# Patient Record
Sex: Male | Born: 1985 | Hispanic: Yes | Marital: Single | Smoking: Never smoker
Health system: Southern US, Community
[De-identification: ages and names within clinical notes are randomized; demographics above are authoritative.]

---

## 2017-07-30 ENCOUNTER — Emergency Department (HOSPITAL_COMMUNITY): Payer: Self-pay

## 2017-07-30 ENCOUNTER — Encounter (HOSPITAL_COMMUNITY): Payer: Self-pay

## 2017-07-30 ENCOUNTER — Emergency Department (HOSPITAL_COMMUNITY)
Admission: EM | Admit: 2017-07-30 | Discharge: 2017-07-31 | Disposition: A | Discharge status: Home / Self Care | Payer: Self-pay | Attending: Emergency Medicine | Admitting: Emergency Medicine

## 2017-07-30 DIAGNOSIS — W132XXA Fall from, out of or through roof, initial encounter: Secondary | ICD-10-CM | POA: Insufficient documentation

## 2017-07-30 DIAGNOSIS — Y999 Unspecified external cause status: Secondary | ICD-10-CM | POA: Insufficient documentation

## 2017-07-30 DIAGNOSIS — Y9389 Activity, other specified: Secondary | ICD-10-CM | POA: Insufficient documentation

## 2017-07-30 DIAGNOSIS — M25561 Pain in right knee: Secondary | ICD-10-CM | POA: Insufficient documentation

## 2017-07-30 DIAGNOSIS — S92214A Nondisplaced fracture of cuboid bone of right foot, initial encounter for closed fracture: Secondary | ICD-10-CM | POA: Insufficient documentation

## 2017-07-30 DIAGNOSIS — W19XXXA Unspecified fall, initial encounter: Secondary | ICD-10-CM

## 2017-07-30 DIAGNOSIS — Y929 Unspecified place or not applicable: Secondary | ICD-10-CM | POA: Insufficient documentation

## 2017-07-30 DIAGNOSIS — R52 Pain, unspecified: Secondary | ICD-10-CM

## 2017-07-30 DIAGNOSIS — M25551 Pain in right hip: Secondary | ICD-10-CM | POA: Insufficient documentation

## 2017-07-30 DIAGNOSIS — S92301A Fracture of unspecified metatarsal bone(s), right foot, initial encounter for closed fracture: Secondary | ICD-10-CM | POA: Insufficient documentation

## 2017-07-30 MED ORDER — OXYCODONE-ACETAMINOPHEN 5-325 MG PO TABS
1.0000 | ORAL_TABLET | Freq: Once | ORAL | Status: DC
  Filled 2017-07-30: qty 1

## 2017-07-30 MED ORDER — OXYCODONE-ACETAMINOPHEN 5-325 MG PO TABS
1.0000 | ORAL_TABLET | Freq: Once | ORAL | Status: AC
  Administered 2017-07-30: 1 via ORAL
  Filled 2017-07-30: qty 1

## 2017-07-30 NOTE — ED Notes (Signed)
Pedal pulse noted and extremity warm to touch.

## 2017-07-30 NOTE — ED Notes (Addendum)
Tried to pull oxycodone for patient but was unable to due to the fact another nurse pulled med already. Patient states a male nurse gave him pain meds at approx 2000pm but documentation was not done.

## 2017-07-30 NOTE — ED Triage Notes (Signed)
Pt presents with R knee and foot pain after falling off roof PTA  Pt reports landing on grass, feet first, denies any LOC.Marland Kitchen.  Pt able to ambulate but with pain.

## 2017-07-30 NOTE — ED Provider Notes (Signed)
[facility] EMERGENCY [facility] Provider Note   CSN: [id number] Arrival date & time: 07/30/17  1841     History   Chief Complaint Chief Complaint  Patient presents with  . Fall    HPI Drew Rodriguez is a 31 y.o. male.  Geoff Jonetta SpeakLuis Juleen Rodriguez is a 31 y.o. Male who presents to the ED after a fall off the roof complaining of right foot pain.  Patient reports he was up on a roof of a one story building about 12 feet in the air.  He reports he fell and landed on his right foot.  He reports pain to his right foot, right knee and his right hip.  He denies hitting his head or loss of consciousness. He denies any pain to his left foot.  He denies any pain to his back.  He reports he is ambulatory after the fall briefly.  No treatments prior to arrival to the emergency department.  He did receive a Percocet and triaged that helps him with his pain.  No other treatments attempted.  He denies numbness, tingling, weakness, head injury, loss of consciousness, back pain, abdominal pain, chest pain or shortness of breath.   The history is provided by the patient, medical records and a friend. A language interpreter was used.  Fall  Pertinent negatives include no chest pain, no abdominal pain, no headaches and no shortness of breath.    History reviewed. No pertinent past medical history.  There are no active problems to display for this patient.   History reviewed. No pertinent surgical history.     Home Medications    Prior to Admission medications   Medication Sig Start Date End Date Taking? Authorizing Provider  HYDROcodone-acetaminophen (NORCO/VICODIN) 5-325 MG tablet Take 1-2 tablets every 6 (six) hours as needed by mouth for moderate pain or severe pain. 07/31/17   Everlene Farrieransie, Nickolaus Bordelon, PA-C  naproxen (NAPROSYN) 375 MG tablet Take 1 tablet (375 mg total) 2 (two) times daily with a meal by mouth. 07/31/17   Everlene Farrieransie, Brooklyn Alfredo, PA-C    Family History History  reviewed. No pertinent family history.  Social History Social History   Tobacco Use  . Smoking status: Never Smoker  . Smokeless tobacco: Never Used  Substance Use Topics  . Alcohol use: Yes  . Drug use: No     Allergies   Patient has no known allergies.   Review of Systems Review of Systems  Constitutional: Negative for chills and fever.  HENT: Negative for congestion and sore throat.   Eyes: Negative for visual disturbance.  Respiratory: Negative for cough and shortness of breath.   Cardiovascular: Negative for chest pain.  Gastrointestinal: Negative for abdominal pain, diarrhea, nausea and vomiting.  Genitourinary: Negative for dysuria.  Musculoskeletal: Positive for arthralgias and joint swelling. Negative for back pain and neck pain.  Skin: Negative for rash.  Neurological: Negative for weakness, light-headedness, numbness and headaches.     Physical Exam Updated Vital Signs BP 113/65   Pulse 62   Temp 99.2 F (37.3 C) (Oral)   Resp 17   SpO2 97%   Physical Exam  Constitutional: He appears well-developed and well-nourished. No distress.  HENT:  Head: Normocephalic and atraumatic.  Right Ear: External ear normal.  Left Ear: External ear normal.  Eyes: Conjunctivae are normal. Pupils are equal, round, and reactive to light. Right eye exhibits no discharge. Left eye exhibits no discharge.  Neck: Neck supple.  Cardiovascular: Normal rate, regular rhythm, normal heart sounds  and intact distal pulses.  .Bilateral radial, posterior tibialis and dorsalis pedis pulses are intact.    Pulmonary/Chest: Effort normal and breath sounds normal. No respiratory distress.  Abdominal: Soft. There is no tenderness.  Musculoskeletal: He exhibits edema and tenderness. He exhibits no deformity.  Edema and TTP to the dorsum of his right foot without deformity.  No right ankle tenderness.  Mild tenderness to the anterior aspect of his right knee.  Mild tenderness to the lateral  aspect of his right hip.  No midline neck or back tenderness.  No deformity.  Lymphadenopathy:    He has no cervical adenopathy.  Neurological: He is alert. No sensory deficit. Coordination normal.  Skin: Skin is warm and dry. Capillary refill takes less than 2 seconds. No rash noted. He is not diaphoretic. No erythema. No pallor.  Psychiatric: He has a normal mood and affect. His behavior is normal.  Nursing note and vitals reviewed.    ED Treatments / Results  Labs (all labs ordered are listed, but only abnormal results are displayed) Labs Reviewed - No data to display  EKG  EKG Interpretation None       Radiology Dg Lumbar Spine Complete  Result Date: 07/31/2017 CLINICAL DATA:  Fall from roof.  Low back pain. EXAM: LUMBAR SPINE - COMPLETE 4+ VIEW COMPARISON:  None. FINDINGS: There is no evidence of lumbar spine fracture. Alignment is normal. Intervertebral disc spaces are maintained. IMPRESSION: No fracture or listhesis of the lumbar spine. Electronically Signed   By: Deatra RobinsonKevin  Herman M.D.   On: 07/31/2017 00:29   Dg Ankle Complete Right  Result Date: 07/30/2017 CLINICAL DATA:  Right ankle pain after fall from roof. EXAM: RIGHT ANKLE - COMPLETE 3+ VIEW COMPARISON:  None. FINDINGS: Slightly avulsed fracture off the lateral aspect of the cuboid is noted. The ankle and subtalar joints are maintained. No calcaneal fracture. Mild soft tissue swelling about the ankle and midfoot. IMPRESSION: Slightly avulsed acute fracture off the lateral aspect of the cuboid. Electronically Signed   By: Tollie Ethavid  Kwon M.D.   On: 07/30/2017 20:10   Dg Knee Complete 4 Views Right  Result Date: 07/30/2017 CLINICAL DATA:  Patient fell from roof.  Right knee pain. EXAM: RIGHT KNEE - COMPLETE 4+ VIEW COMPARISON:  None. FINDINGS: No evidence of fracture, dislocation, or joint effusion. No evidence of arthropathy or other focal bone abnormality. Soft tissues are unremarkable. IMPRESSION: No acute fracture or  malalignment of the right knee. No joint effusion. Electronically Signed   By: Tollie Ethavid  Kwon M.D.   On: 07/30/2017 20:11   Dg Foot Complete Right  Result Date: 07/30/2017 CLINICAL DATA:  Patient fell from roof.  Foot pain. EXAM: RIGHT FOOT COMPLETE - 3+ VIEW COMPARISON:  None. FINDINGS: There are acute, closed, right second through fourth metatarsal neck fractures with lateral angulation of the distal fracture fragments. No intra-articular involvement is noted. No joint dislocation. Forefoot soft tissue swelling is seen. The Lisfranc articulation is maintained. An acute fracture involving the lateral cortex of the cuboid is noted with slight avulsion. The tibiotalar and subtalar joints are maintained. No calcaneal fracture. IMPRESSION: 1. Acute right second through fourth metatarsal neck fractures with lateral angulation of the distal fracture fragments. No joint dislocations. 2. Fracture of the posterolateral cuboid with minimal avulsion of the fracture fragment. Electronically Signed   By: Tollie Ethavid  Kwon M.D.   On: 07/30/2017 20:09   Dg Hip Unilat With Pelvis 2-3 Views Right  Result Date: 07/31/2017 CLINICAL DATA:  Right  hip pain after falling from roof EXAM: DG HIP (WITH OR WITHOUT PELVIS) 2-3V RIGHT COMPARISON:  None. FINDINGS: There is no evidence of hip fracture or dislocation. There is no evidence of arthropathy or other focal bone abnormality. IMPRESSION: No fracture or dislocation of the right hip. Electronically Signed   By: Deatra Robinson M.D.   On: 07/31/2017 00:30    Procedures Procedures (including critical care time)  Medications Ordered in ED Medications  oxyCODONE-acetaminophen (PERCOCET/ROXICET) 5-325 MG per tablet 1 tablet (not administered)  oxyCODONE-acetaminophen (PERCOCET/ROXICET) 5-325 MG per tablet 1 tablet (1 tablet Oral Given 07/30/17 2311)     Initial Impression / Assessment and Plan / ED Course  I have reviewed the triage vital signs and the nursing notes.  Pertinent  labs & imaging results that were available during my care of the patient were reviewed by me and considered in my medical decision making (see chart for details).    This is a 31 y.o. Male who presents to the ED after a fall off the roof complaining of right foot pain.  Patient reports he was up on a roof of a one story building about 12 feet in the air.  He reports he fell and landed on his right foot.  He reports pain to his right foot, right knee and his right hip.  He denies hitting his head or loss of consciousness. He denies any pain to his left foot.  He denies any pain to his back.  He reports he is ambulatory after the fall briefly.  No treatments prior to arrival to the emergency department.  On exam the patient has tenderness and edema noted to the dorsum of his right foot.  He is neurovascularly intact.  Good capillary refill.  No deformity.  No tenderness to his right ankle.  He does have mild tenderness to the anterior aspect of his right knee and the lateral aspect of his right hip.  He has good range of motion of his knee and hip. X-ray of his right foot shows right second through fourth metatarsal neck fractures with lateral angulation of the distal fracture fragments.  No joint dislocations.  There is also fracture of the posterior lateral cuboid with minimal avulsion of the fracture fragment. Ankle and knee x-rays show no acute findings.  Patient is complaining of right hip pain as well.  Will x-ray his right hip as well as his lumbar spine due to concern for lumbar injury with this significant fall and injury. X-ray of his lumbar spine and hip are unremarkable. He reports feeling better at reevaluation after pain medication. I consulted with orthopedic surgeon Dr. Aundria Rud and he agrees with plan to place the patient in a posterior splint and have him nonweightbearing with crutches.  He will see him in follow-up in about 2-3 weeks.   I discussed this plan with the patient and he agrees.   Will discharge with prescriptions for naproxen and Norco.  The patient was looked up on the [place] controlled substance database.  No recent prescriptions for any narcotic pain medicines.  I discussed pain medication precautions with the patient.  I also discussed precautions when using a splint. I advised the patient to follow-up with their primary care provider this week. I advised the patient to return to the emergency department with new or worsening symptoms or new concerns. The patient verbalized understanding and agreement with plan.     Final Clinical Impressions(s) / ED Diagnoses   Final diagnoses:  Closed fracture of neck of metatarsal bone of right foot, initial encounter  Closed nondisplaced fracture of cuboid of right foot, initial encounter  Fall from roof, initial encounter  Acute pain of right knee  Right hip pain    ED Discharge Orders        Ordered    naproxen (NAPROSYN) 375 MG tablet  2 times daily with meals     07/31/17 0203    HYDROcodone-acetaminophen (NORCO/VICODIN) 5-325 MG tablet  Every 6 hours PRN     07/31/17 0203       Everlene Farrier, PA-C 07/31/17 0211    Mancel Bale, MD 08/08/17 [phone]

## 2017-07-30 NOTE — ED Notes (Signed)
Pt. To xray via stretcher
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
## 2017-07-30 NOTE — ED Notes (Signed)
Pt. Return from XRAY via stretcher. 

## 2017-07-31 MED ORDER — HYDROCODONE-ACETAMINOPHEN 5-325 MG PO TABS: 1.0000 | ORAL_TABLET | Freq: Four times a day (QID) | ORAL | 0 refills | Status: AC | PRN

## 2017-07-31 MED ORDER — NAPROXEN 375 MG PO TABS: 375.0000 mg | ORAL_TABLET | Freq: Two times a day (BID) | ORAL | 0 refills | Status: AC

## 2017-07-31 NOTE — ED Notes (Signed)
Ortho Tech Paged 

## 2017-07-31 NOTE — Progress Notes (Signed)
Orthopedic Tech Progress Note Patient Details:  Hillery AldoJose Luis [facility] 01-13-1986 [id number]  Ortho Devices Type of Ortho Device: Crutches, Post (short leg) splint Ortho Device/Splint Location: rle Ortho Device/Splint Interventions: Ordered, Application, Adjustment   Trinna PostMartinez, Adalea Handler J 07/31/2017, 1:55 AM

## 2018-11-06 IMAGING — DX DG LUMBAR SPINE COMPLETE 4+V
5 series · 5 of 5 positions shown · non-contrast
Comparison: None.

CLINICAL DATA: Fall from roof.  Low back pain.

EXAM:
LUMBAR SPINE - COMPLETE 4+ VIEW

[l-spine ap]
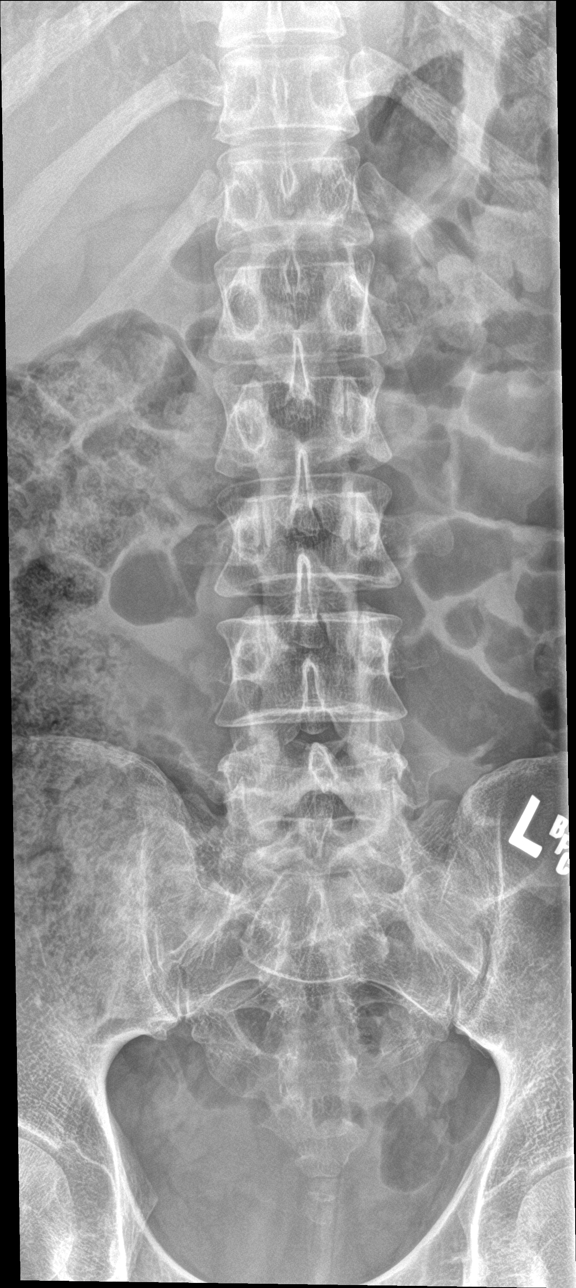

[l-spine obl (1 of 2)]
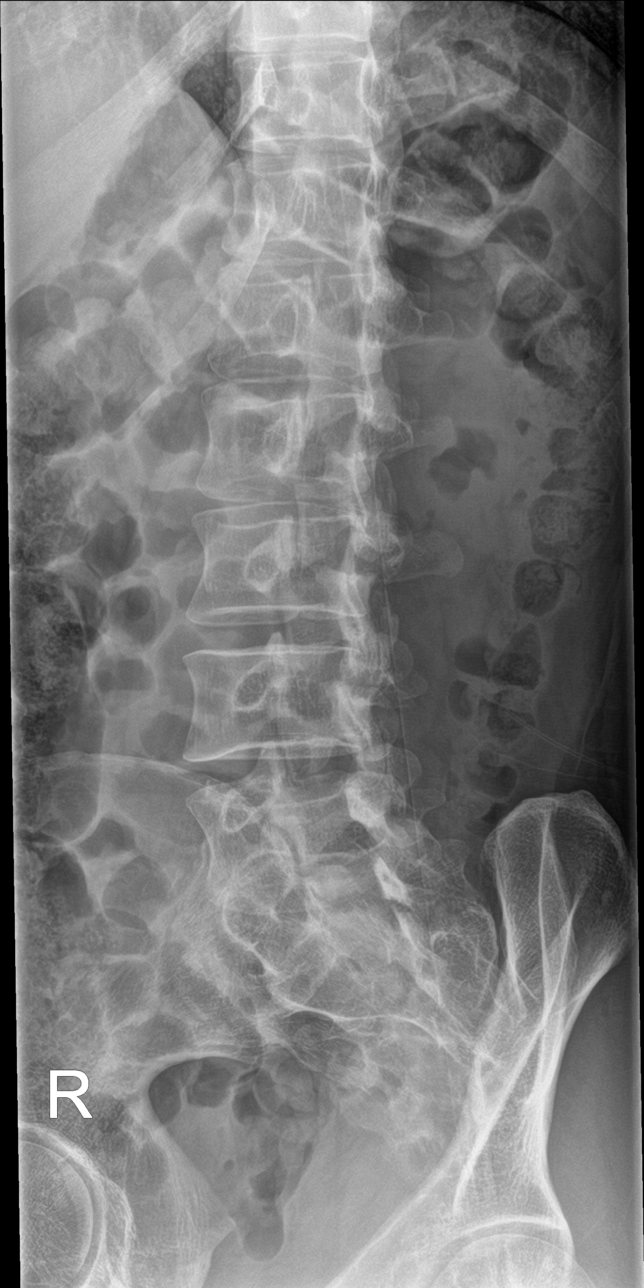

[l-spine obl (2 of 2)]
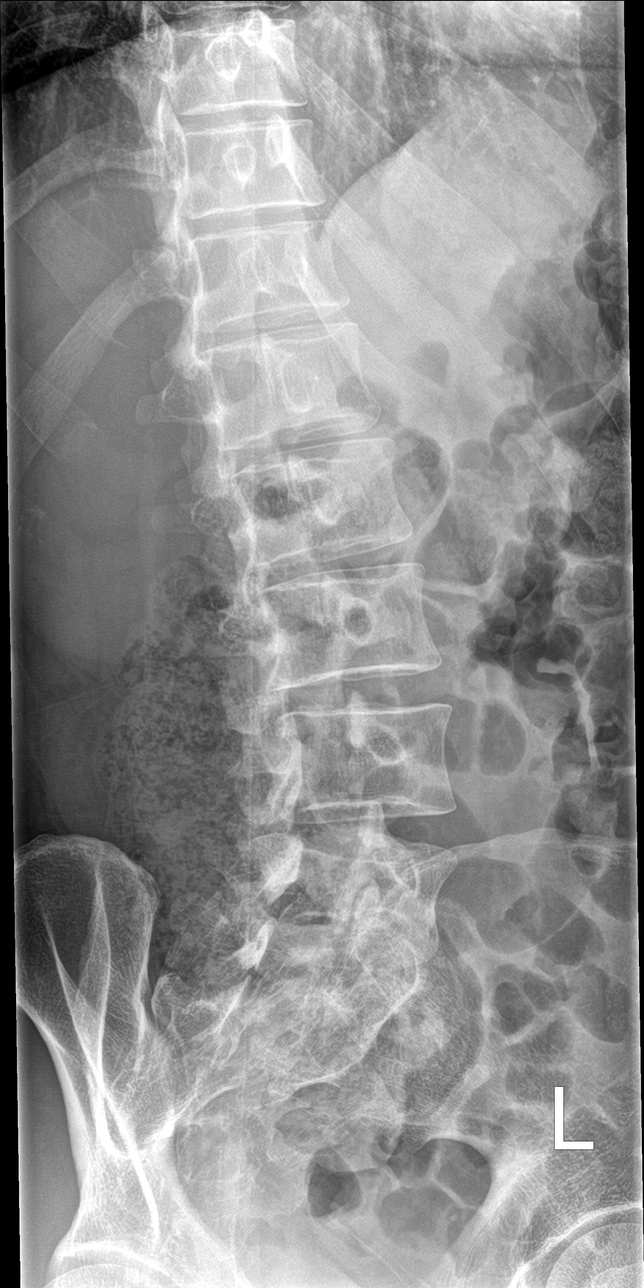

[l-spine lat]
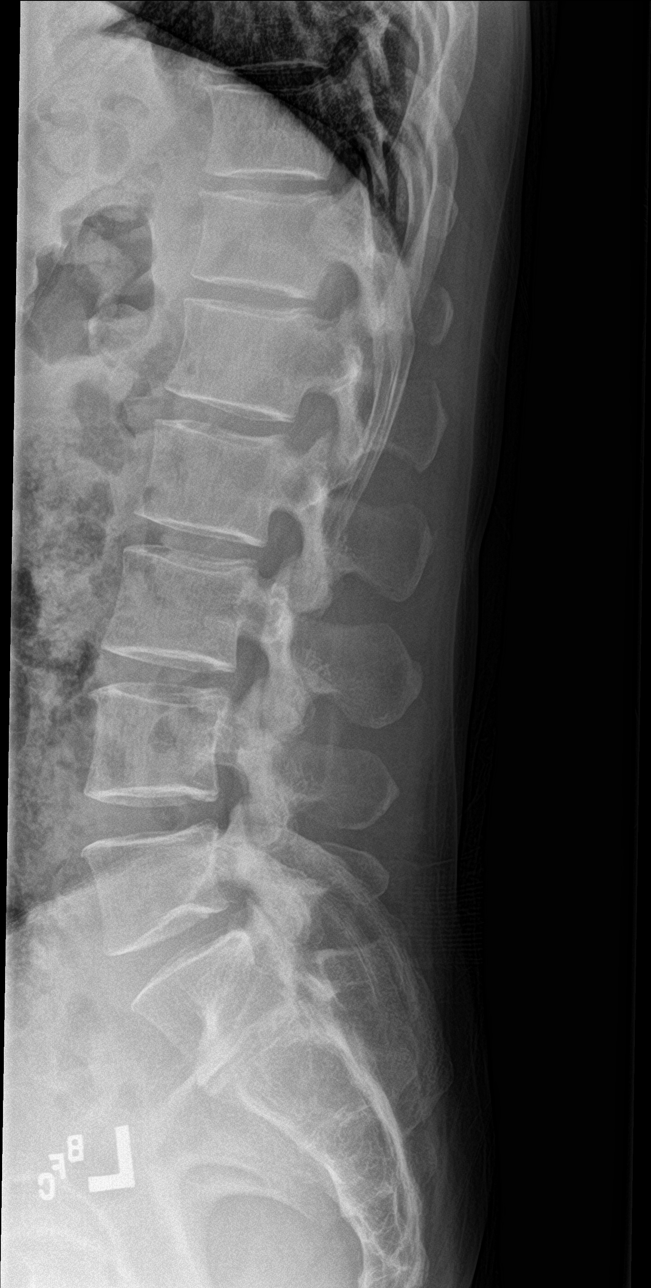

[l-spine spot]
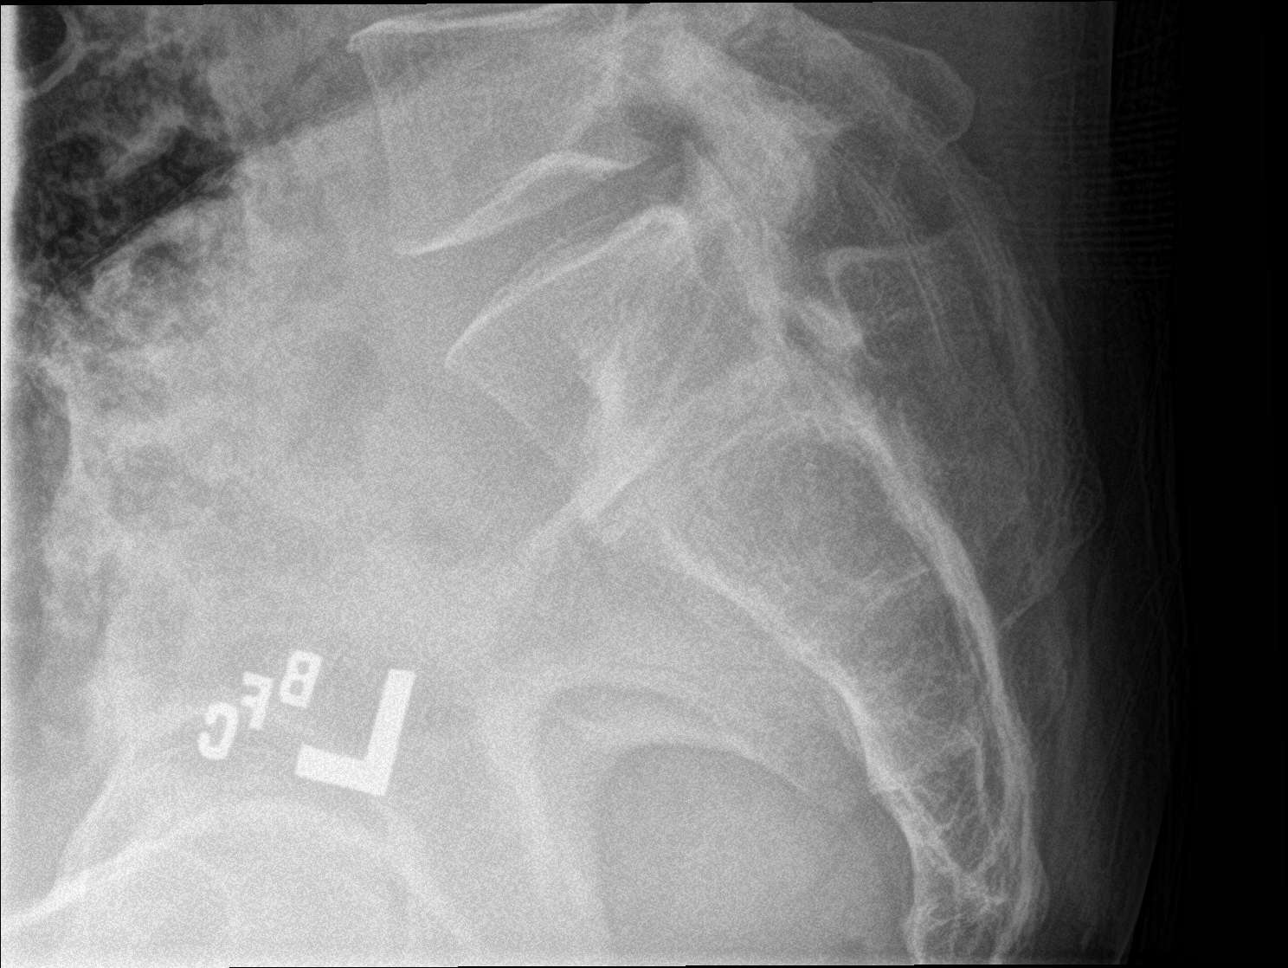

[5 of 5 positions shown; findings below may reference images not displayed]

FINDINGS: There is no evidence of lumbar spine fracture. Alignment is normal.
Intervertebral disc spaces are maintained.
IMPRESSION: No fracture or listhesis of the lumbar spine.

## 2018-11-06 IMAGING — CR DG ANKLE COMPLETE 3+V*R*
3 series · 3 of 3 positions shown · non-contrast
Comparison: None.

CLINICAL DATA: Right ankle pain after fall from roof.

EXAM:
RIGHT ANKLE - COMPLETE 3+ VIEW

[ankle ap]
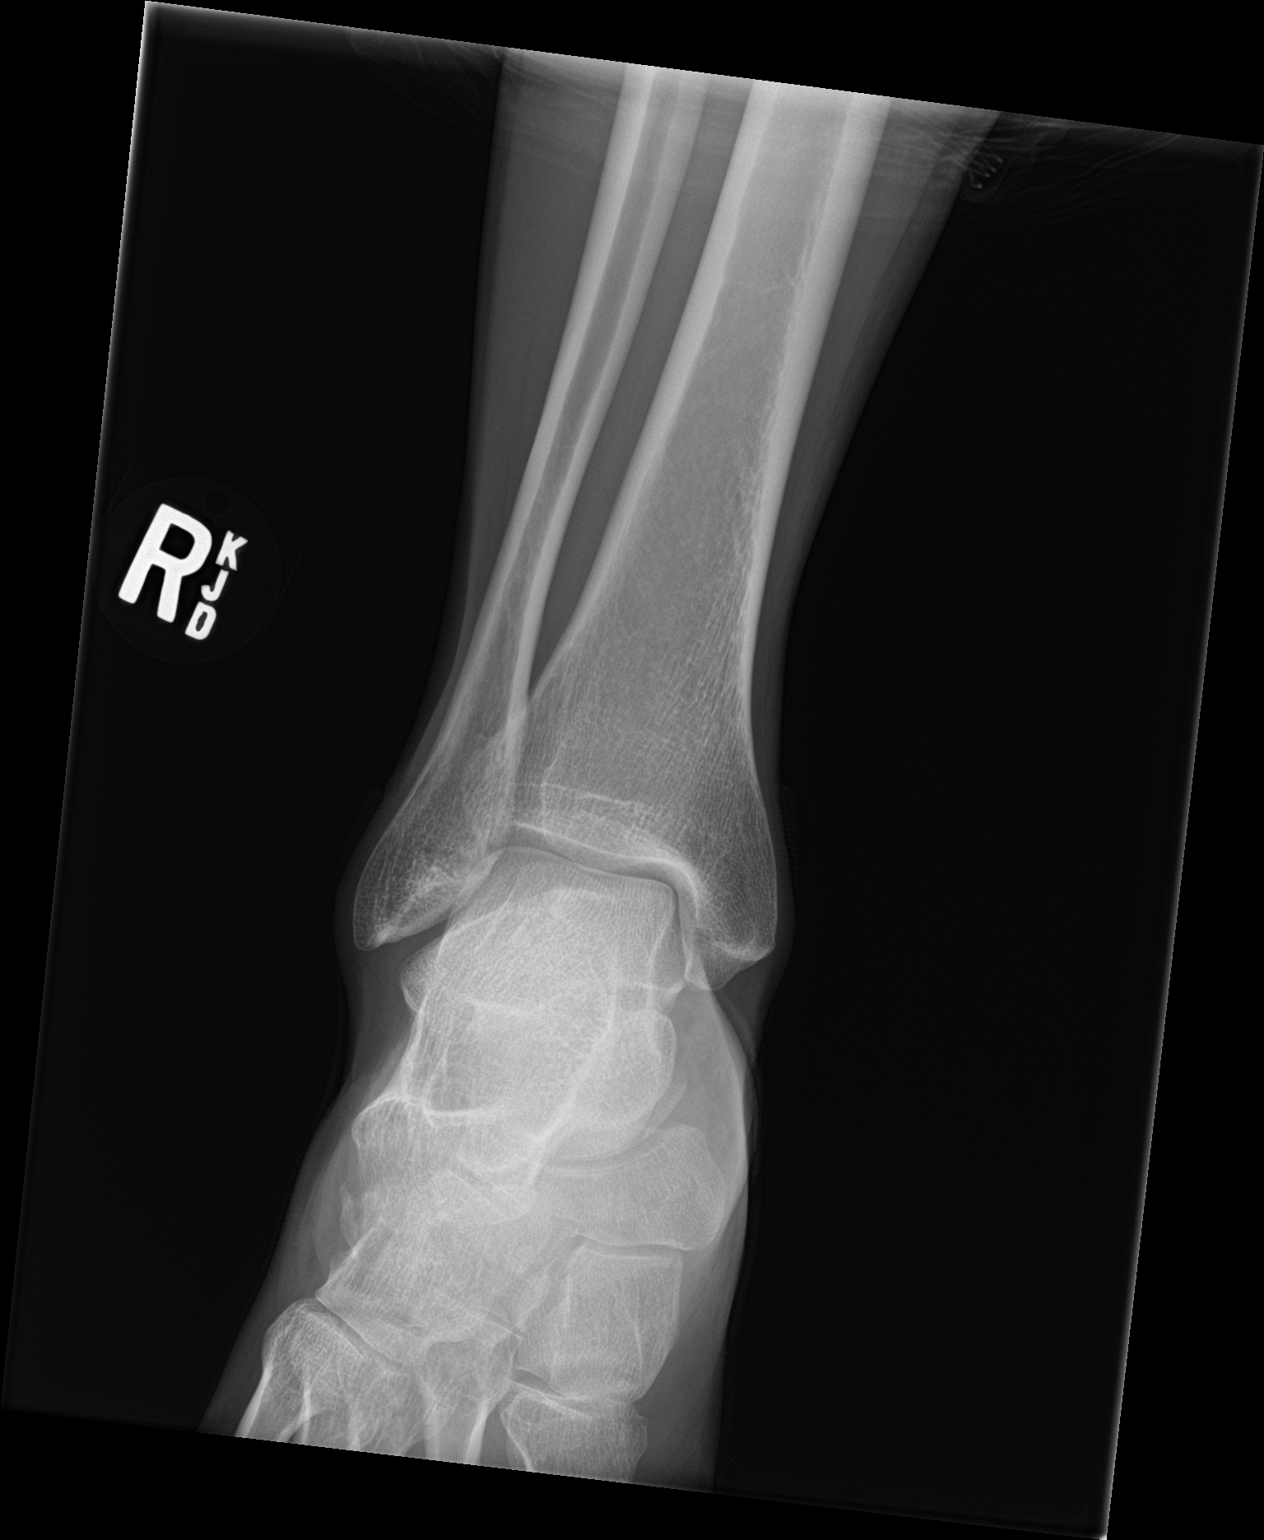

[ankle obl]
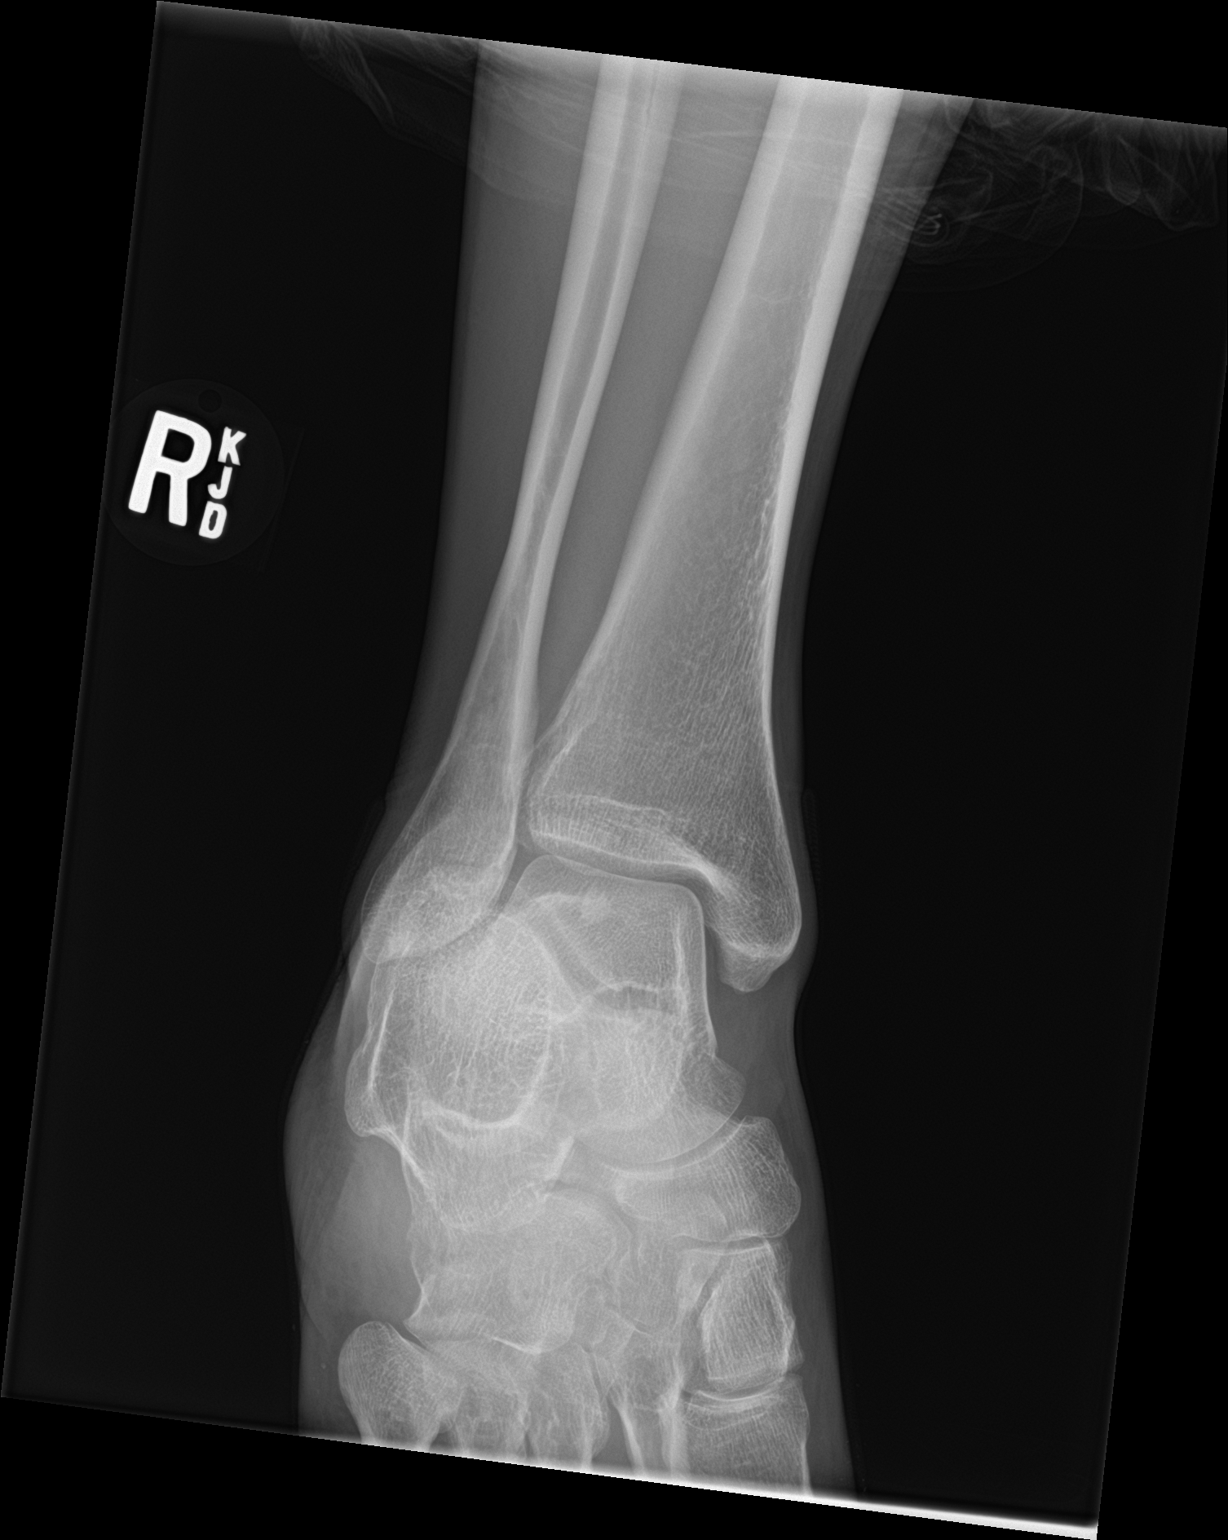

[ankle lat]
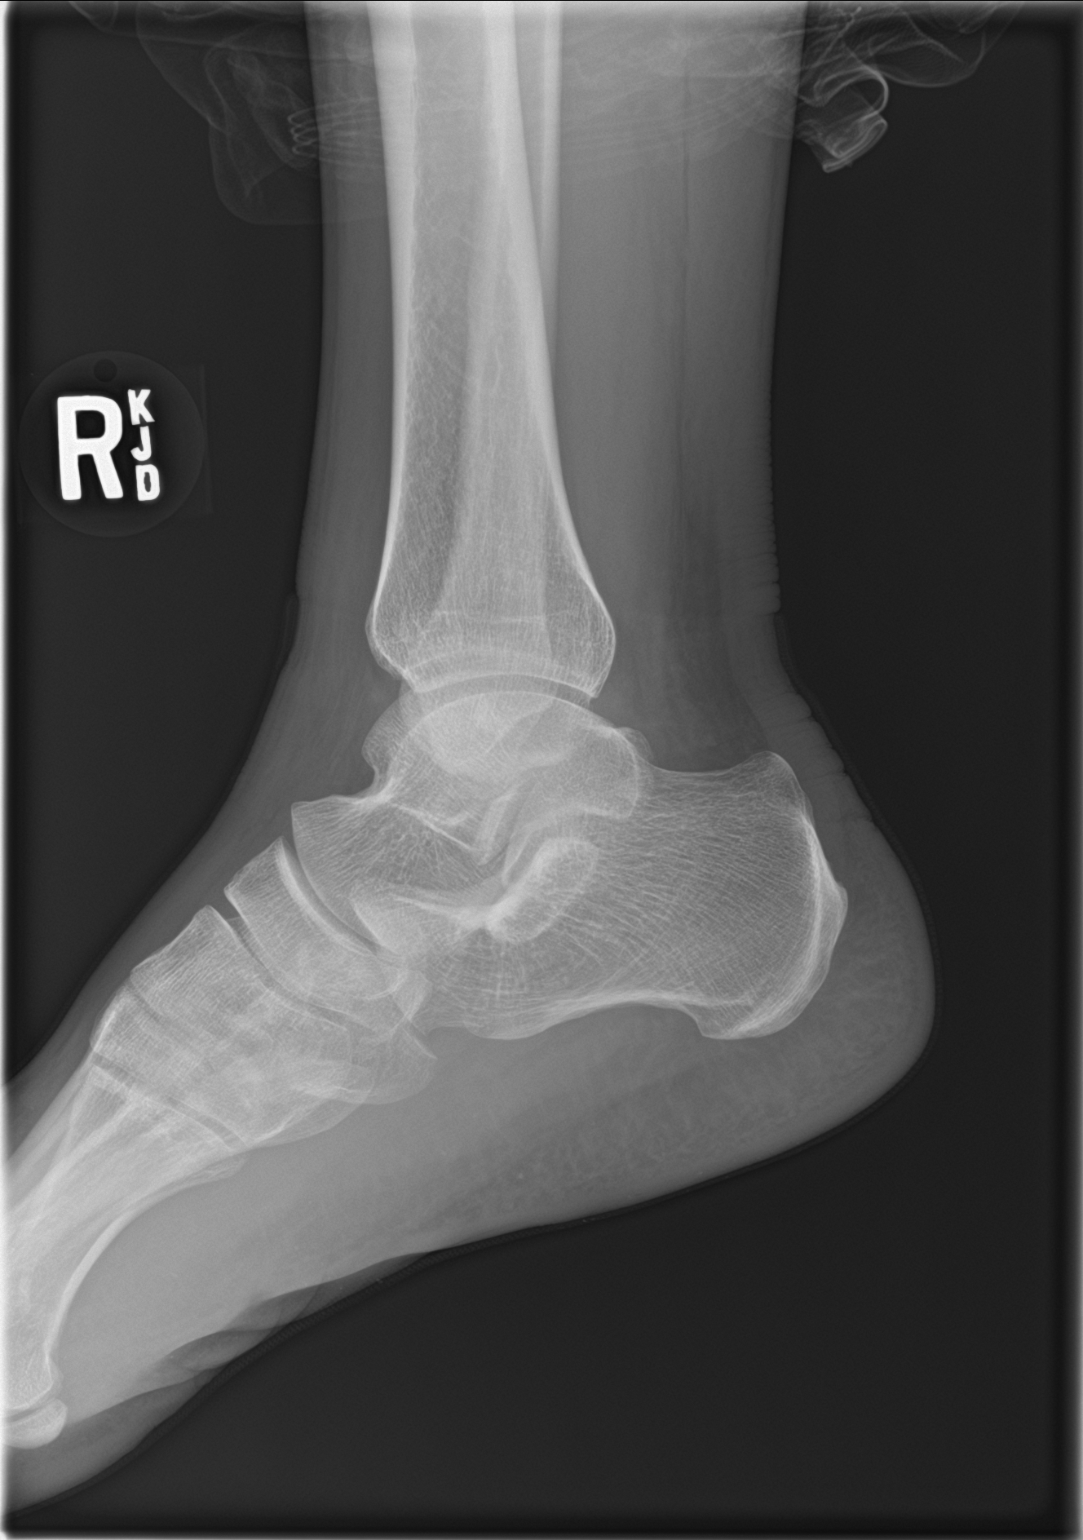

[3 of 3 positions shown; findings below may reference images not displayed]

FINDINGS: Slightly avulsed fracture off the lateral aspect of the cuboid is
noted. The ankle and subtalar joints are maintained. No calcaneal
fracture. Mild soft tissue swelling about the ankle and midfoot.
IMPRESSION: Slightly avulsed acute fracture off the lateral aspect of the
cuboid.

## 2018-11-06 IMAGING — CR DG KNEE COMPLETE 4+V*R*
4 series · 4 of 4 positions shown · non-contrast
Comparison: None.

CLINICAL DATA: Patient fell from roof.  Right knee pain.

EXAM:
RIGHT KNEE - COMPLETE 4+ VIEW

[knee ap]
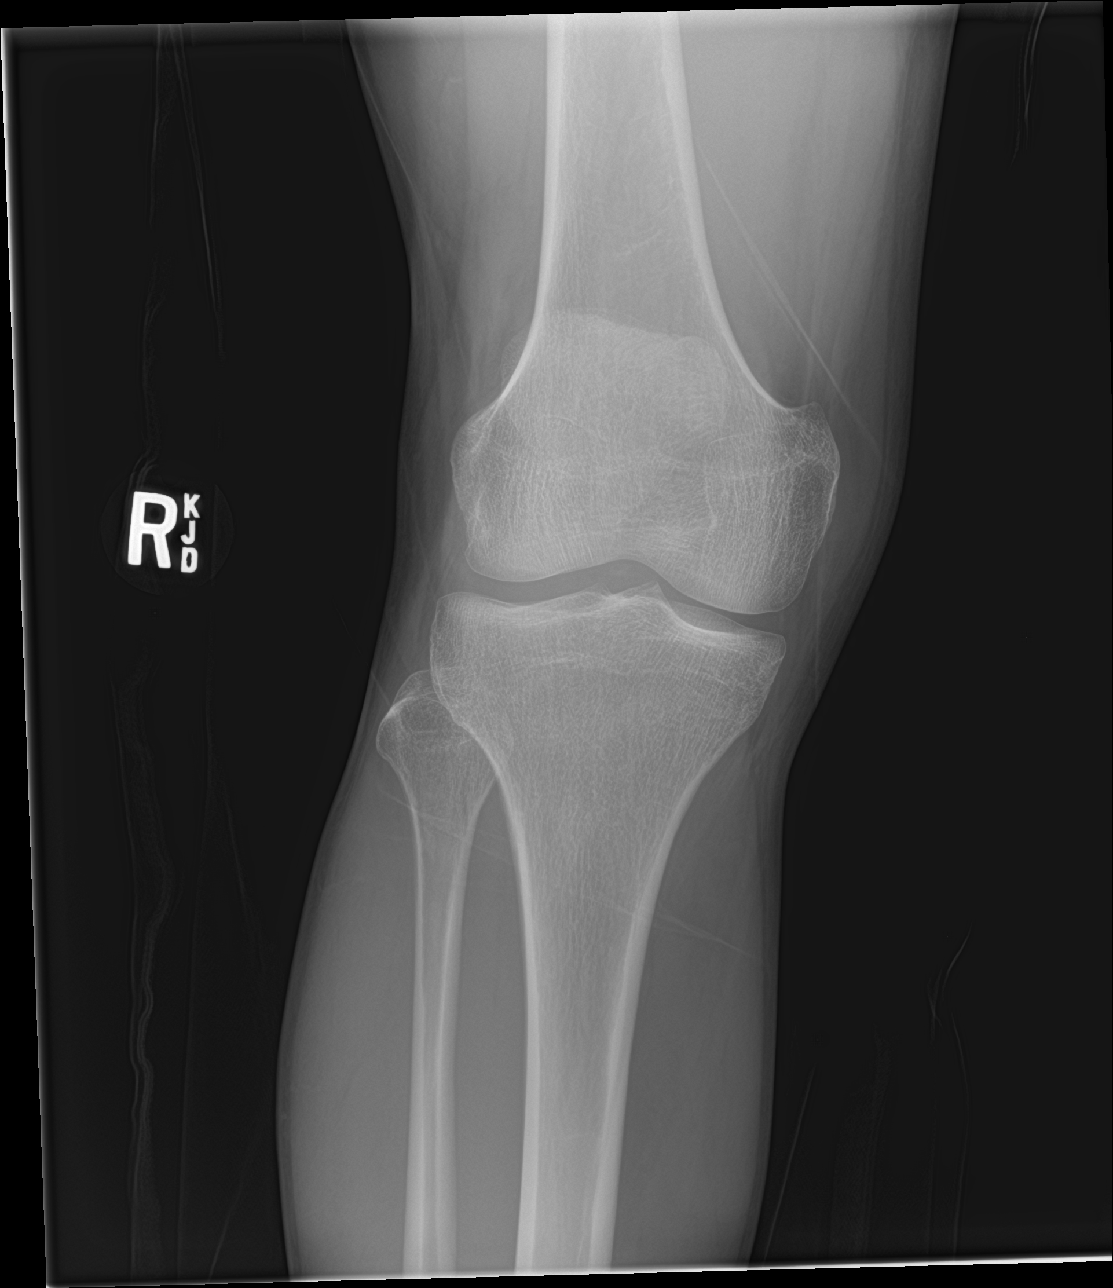

[knee lat]
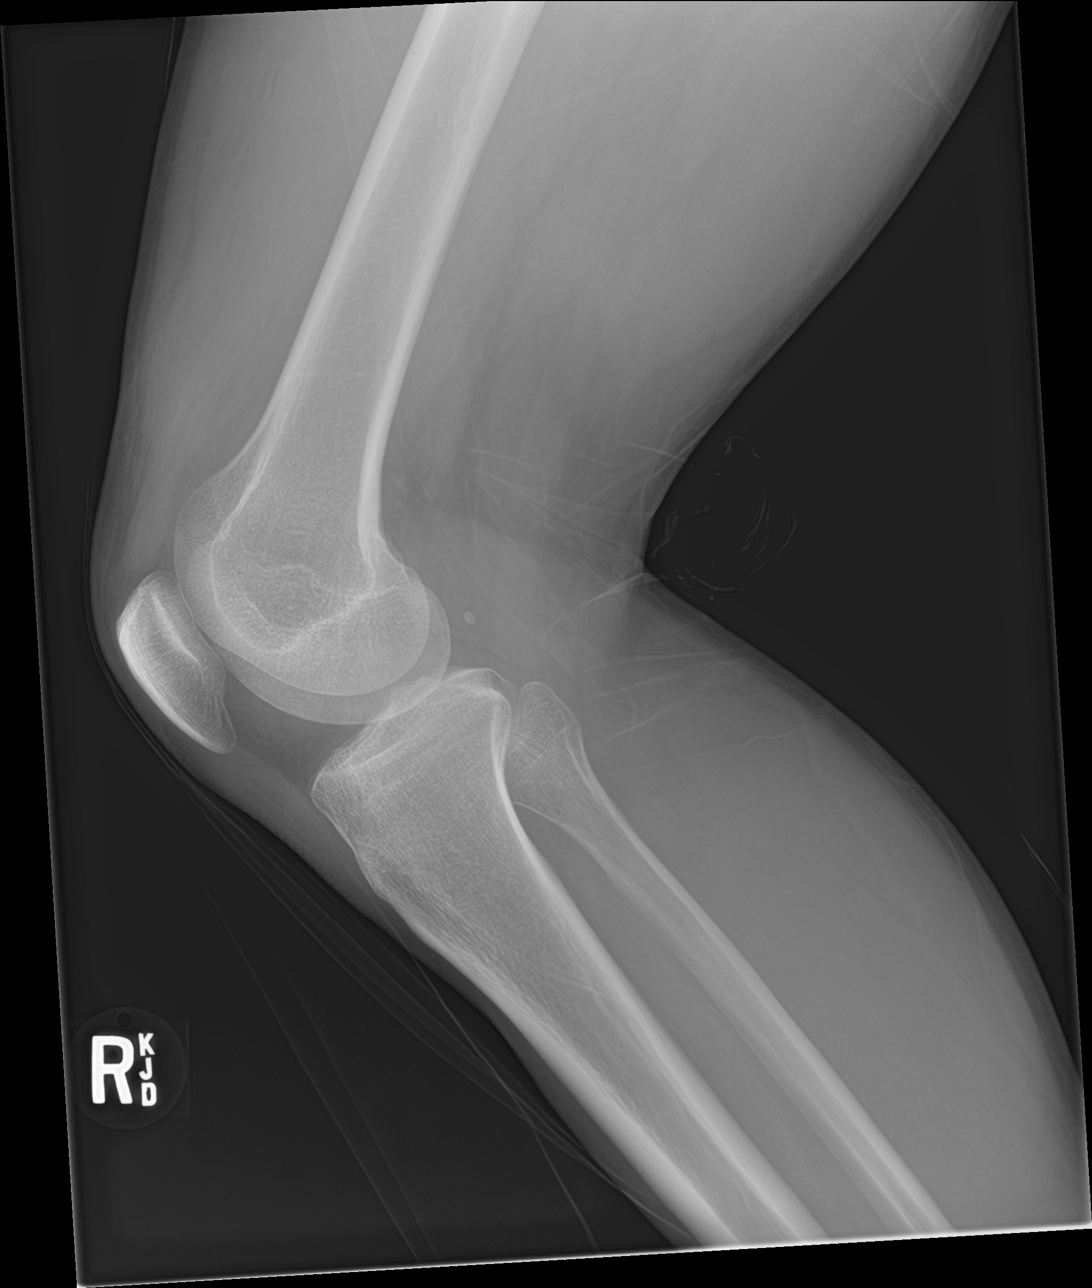

[knee obl (1 of 2)]
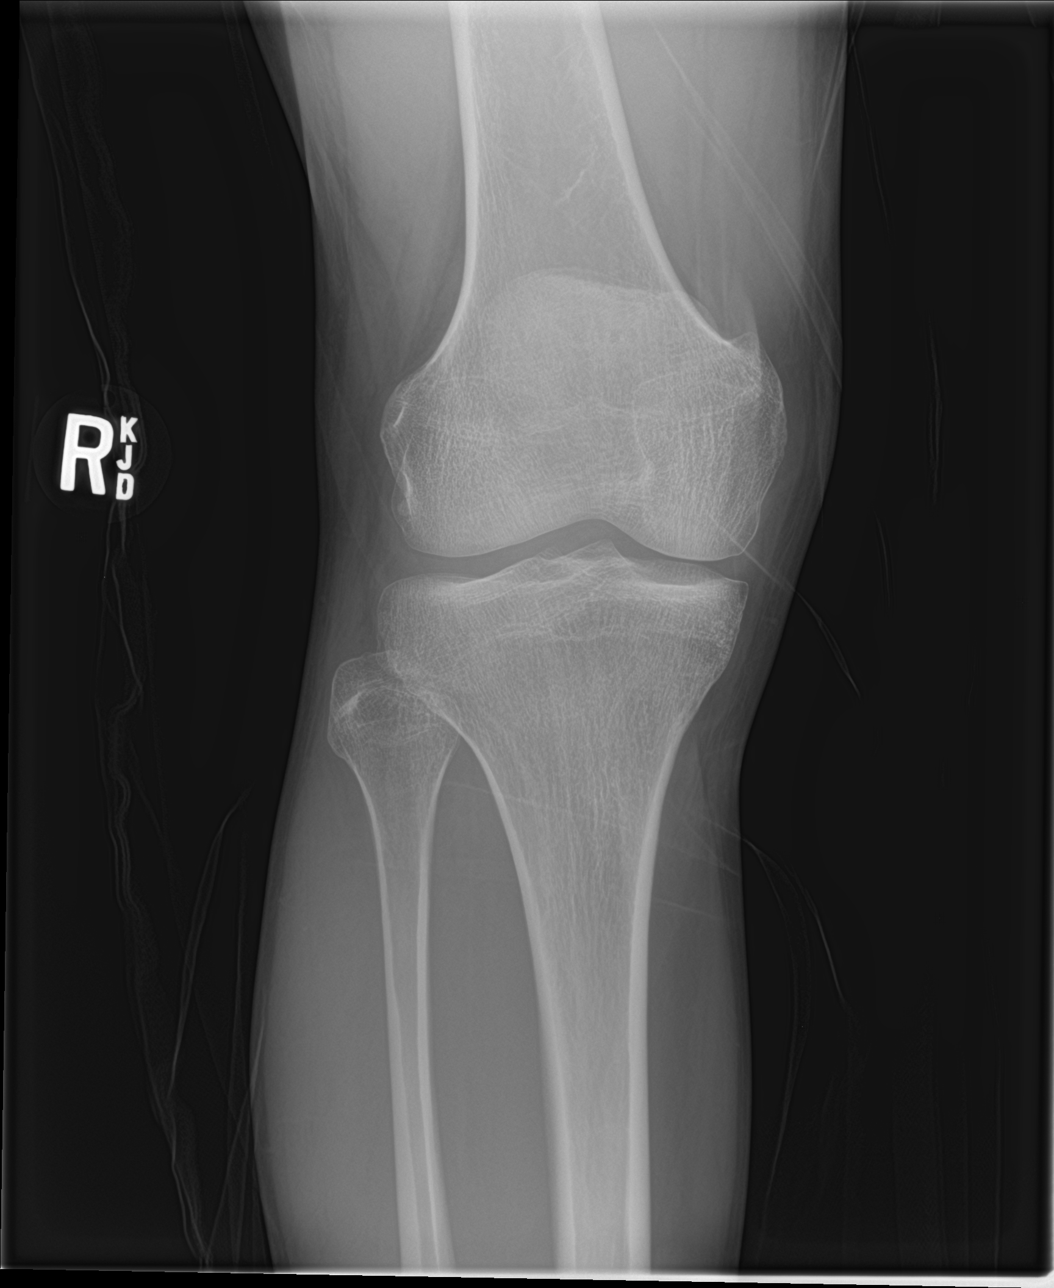

[knee obl (2 of 2)]
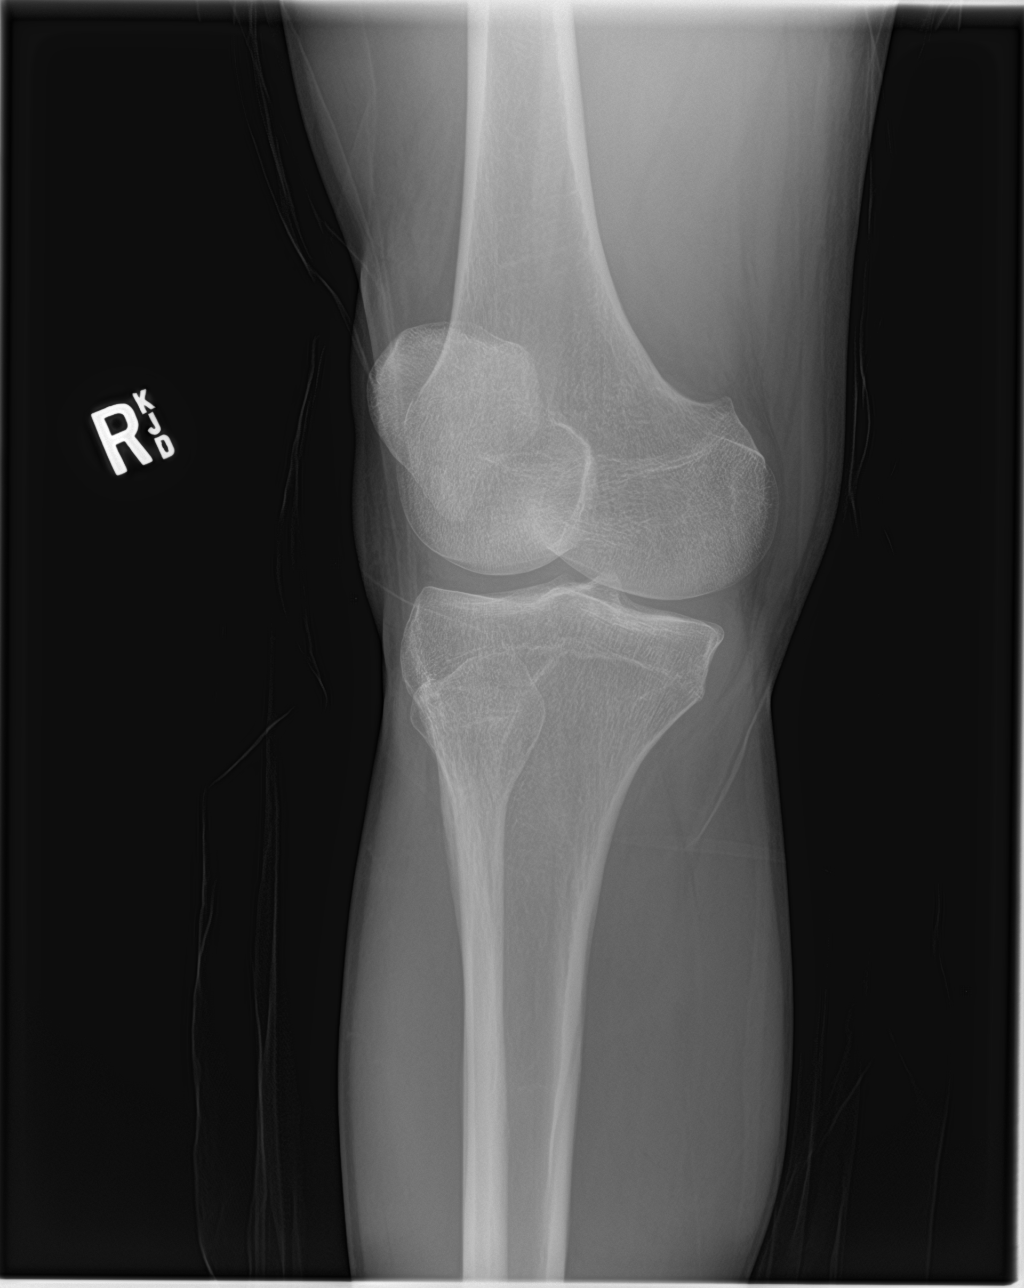

[4 of 4 positions shown; findings below may reference images not displayed]

FINDINGS: No evidence of fracture, dislocation, or joint effusion. No evidence
of arthropathy or other focal bone abnormality. Soft tissues are
unremarkable.
IMPRESSION: No acute fracture or malalignment of the right knee. No joint
effusion.
